# Patient Record
Sex: Male | Born: 1990 | Hispanic: No | Marital: Single | Smoking: Light tobacco smoker
Health system: Southern US, Community
[De-identification: ages and names within clinical notes are randomized; demographics above are authoritative.]

## PROBLEM LIST (undated history)

## (undated) ENCOUNTER — Emergency Department (HOSPITAL_COMMUNITY): Payer: Self-pay | Source: Home / Self Care

## (undated) HISTORY — PX: NO PAST SURGERIES: SHX2092

---
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
## 2014-01-23 ENCOUNTER — Ambulatory Visit (INDEPENDENT_AMBULATORY_CARE_PROVIDER_SITE_OTHER): Payer: 59 | Admitting: Physician Assistant

## 2014-01-23 VITALS — BP 120/76 | HR 74 | Temp 98.1°F | Resp 20 | Ht 68.0 in | Wt 215.0 lb

## 2014-01-23 DIAGNOSIS — R21 Rash and other nonspecific skin eruption: Secondary | ICD-10-CM

## 2014-01-23 DIAGNOSIS — N489 Disorder of penis, unspecified: Secondary | ICD-10-CM

## 2014-01-23 DIAGNOSIS — N4889 Other specified disorders of penis: Secondary | ICD-10-CM

## 2014-01-23 MED ORDER — HYDROXYZINE HCL 25 MG PO TABS: 25.0000 mg | ORAL_TABLET | Freq: Every evening | ORAL | Status: DC | PRN

## 2014-01-23 MED ORDER — CETIRIZINE HCL 10 MG PO TABS: 10.0000 mg | ORAL_TABLET | Freq: Every day | ORAL | Status: DC

## 2014-01-23 MED ORDER — PERMETHRIN 5 % EX CREA: 1.0000 "application " | TOPICAL_CREAM | Freq: Once | CUTANEOUS | Status: DC

## 2014-01-23 NOTE — Patient Instructions (Signed)
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
## 2014-01-24 LAB — RPR

## 2014-03-26 ENCOUNTER — Ambulatory Visit (INDEPENDENT_AMBULATORY_CARE_PROVIDER_SITE_OTHER): Payer: 59

## 2014-03-26 ENCOUNTER — Ambulatory Visit (INDEPENDENT_AMBULATORY_CARE_PROVIDER_SITE_OTHER): Payer: 59 | Admitting: Family Medicine

## 2014-03-26 VITALS — BP 102/68 | HR 89 | Temp 98.3°F | Resp 16 | Ht 69.0 in | Wt 209.0 lb

## 2014-03-26 DIAGNOSIS — M25561 Pain in right knee: Secondary | ICD-10-CM

## 2014-03-26 DIAGNOSIS — M25571 Pain in right ankle and joints of right foot: Secondary | ICD-10-CM

## 2014-03-26 DIAGNOSIS — T148 Other injury of unspecified body region: Secondary | ICD-10-CM

## 2014-03-26 DIAGNOSIS — T148XXA Other injury of unspecified body region, initial encounter: Secondary | ICD-10-CM
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
## 2014-03-26 NOTE — Patient Instructions (Signed)

## 2014-10-07 ENCOUNTER — Ambulatory Visit (INDEPENDENT_AMBULATORY_CARE_PROVIDER_SITE_OTHER): Payer: Self-pay | Admitting: Family Medicine

## 2014-10-07 VITALS — BP 110/66 | HR 65 | Temp 97.5°F | Resp 17 | Ht 69.0 in | Wt 209.0 lb

## 2014-10-07 DIAGNOSIS — B354 Tinea corporis: Secondary | ICD-10-CM

## 2014-10-07 MED ORDER — KETOCONAZOLE 2 % EX CREA: 1.0000 "application " | TOPICAL_CREAM | Freq: Two times a day (BID) | CUTANEOUS | Status: DC

## 2014-10-07 NOTE — Patient Instructions (Signed)

## 2014-10-07 NOTE — Progress Notes (Signed)
° °  Subjective:    Patient ID: Samuel Castaneda, male    DOB: 1991-01-22, 24 y.o.   MRN: [id number] This chart was scribed for Elvina SidleKurt Lauenstein, MD by Littie Deedsichard Sun, Medical Scribe. This patient was seen in Room 11 and the patient's care was started at 12:55 PM.   HPI HPI Comments: Samuel Castaneda is a 24 y.o. male who [facility] complaining of a rash to his left shoulder, the left side of his forehead, his right arm and lower abdomen area. Patient believes he has ringworm.  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  Review of Systems  Skin: Positive for rash.       Objective:   Physical Exam CONSTITUTIONAL: Well developed/well nourished HEAD: Normocephalic/atraumatic EYES: EOM/PERRL ENMT: Mucous membranes moist NECK: supple no meningeal signs SPINE: entire spine nontender CV: S1/S2 noted, no murmurs/rubs/gallops noted LUNGS: Lungs are clear to auscultation bilaterally, no apparent distress ABDOMEN: soft, nontender, no rebound or guarding GU: no cva tenderness NEURO: Pt is awake/alert, moves all extremitiesx4 EXTREMITIES: pulses normal, full ROM SKIN: Anular rash on his left shoulder with circumferential predominance and central clearing. He also has several other smaller areas of erythema and crustiness on his right arm and lower abdomen. PSYCH: no abnormalities of mood noted        Assessment & Plan:   This chart was scribed in my presence and reviewed by me personally.    ICD-9-CM ICD-10-CM   1. Tinea corporis 110.5 B35.4 ketoconazole (NIZORAL) 2 % cream     Signed, Elvina SidleKurt Lauenstein, MD

## 2015-04-08 ENCOUNTER — Encounter: Payer: Self-pay | Admitting: Physician Assistant

## 2015-04-08 ENCOUNTER — Ambulatory Visit (INDEPENDENT_AMBULATORY_CARE_PROVIDER_SITE_OTHER): Payer: Self-pay | Admitting: Family Medicine

## 2015-04-08 VITALS — BP 118/74 | HR 89 | Temp 98.6°F | Resp 16 | Ht 69.5 in | Wt 213.0 lb

## 2015-04-08 DIAGNOSIS — L239 Allergic contact dermatitis, unspecified cause: Secondary | ICD-10-CM

## 2015-04-08 DIAGNOSIS — L739 Follicular disorder, unspecified: Secondary | ICD-10-CM

## 2015-04-08 MED ORDER — PREDNISONE 20 MG PO TABS: ORAL_TABLET | ORAL | Status: DC

## 2015-04-08 MED ORDER — DOXYCYCLINE HYCLATE 100 MG PO CAPS: 100.0000 mg | ORAL_CAPSULE | Freq: Two times a day (BID) | ORAL | Status: AC

## 2015-04-08 NOTE — Patient Instructions (Signed)
Contact Dermatitis Dermatitis is redness, soreness, and swelling (inflammation) of the skin. Contact dermatitis is a reaction to certain substances that touch the skin. There are two types of contact dermatitis:   Irritant contact dermatitis. This type is caused by something that irritates your skin, such as dry hands from washing them too much. This type does not require previous exposure to the substance for a reaction to occur. This type is more common.  Allergic contact dermatitis. This type is caused by a substance that you are allergic to, such as a nickel allergy or poison ivy. This type only occurs if you have been exposed to the substance (allergen) before. Upon a repeat exposure, your body reacts to the substance. This type is less common. CAUSES  Many different substances can cause contact dermatitis. Irritant contact dermatitis is most commonly caused by exposure to:   Makeup.   Soaps.   Detergents.   Bleaches.   Acids.   Metal salts, such as nickel.  Allergic contact dermatitis is most commonly caused by exposure to:   Poisonous plants.   Chemicals.   Jewelry.   Latex.   Medicines.   Preservatives in products, such as clothing.  RISK FACTORS This condition is more likely to develop in:   People who have jobs that expose them to irritants or allergens.  People who have certain medical conditions, such as asthma or eczema.  SYMPTOMS  Symptoms of this condition may occur anywhere on your body where the irritant has touched you or is touched by you. Symptoms include:  Dryness or flaking.   Redness.   Cracks.   Itching.   Pain or a burning feeling.   Blisters.  Drainage of small amounts of blood or clear fluid from skin cracks. With allergic contact dermatitis, there may also be swelling in areas such as the eyelids, mouth, or genitals.  DIAGNOSIS  This condition is diagnosed with a medical history and physical exam. A patch skin test  may be performed to help determine the cause. If the condition is related to your job, you may need to see an occupational medicine specialist. TREATMENT Treatment for this condition includes figuring out what caused the reaction and protecting your skin from further contact. Treatment may also include:   Steroid creams or ointments. Oral steroid medicines may be needed in more severe cases.  Antibiotics or antibacterial ointments, if a skin infection is present.  Antihistamine lotion or an antihistamine taken by mouth to ease itching.  A bandage (dressing). HOME CARE INSTRUCTIONS Skin Care  Moisturize your skin as needed.   Apply cool compresses to the affected areas.  Try taking a bath with:  Epsom salts. Follow the instructions on the packaging. You can get these at your local pharmacy or grocery store.  Baking soda. Pour a small amount into the bath as directed by your health care provider.  Colloidal oatmeal. Follow the instructions on the packaging. You can get this at your local pharmacy or grocery store.  Try applying baking soda paste to your skin. Stir water into baking soda until it reaches a paste-like consistency.  Do not scratch your skin.  Bathe less frequently, such as every other day.  Bathe in lukewarm water. Avoid using hot water. Medicines  Take or apply over-the-counter and prescription medicines only as told by your health care provider.   If you were prescribed an antibiotic medicine, take or apply your antibiotic as told by your health care provider. Do not stop using the   antibiotic even if your condition starts to improve. General Instructions  Keep all follow-up visits as told by your health care provider. This is important.  Avoid the substance that caused your reaction. If you do not know what caused it, keep a journal to try to track what caused it. Write down:  What you eat.  What cosmetic products you use.  What you drink.  What  you wear in the affected area. This includes jewelry.  If you were given a dressing, take care of it as told by your health care provider. This includes when to change and remove it. SEEK MEDICAL CARE IF:   Your condition does not improve with treatment.  Your condition gets worse.  You have signs of infection such as swelling, tenderness, redness, soreness, or warmth in the affected area.  You have a fever.  You have new symptoms. SEEK IMMEDIATE MEDICAL CARE IF:   You have a severe headache, neck pain, or neck stiffness.  You vomit.  You feel very sleepy.  You notice red streaks coming from the affected area.  Your bone or joint underneath the affected area becomes painful after the skin has healed.  The affected area turns darker.  You have difficulty breathing.   This information is not intended to replace advice given to you by your health care provider. Make sure you discuss any questions you have with your health care provider.   Document Released: 05/15/2000 Document Revised: 02/06/2015 Document Reviewed: 10/03/2014 Elsevier Interactive Patient Education 2016 Elsevier Inc.  

## 2015-04-09 ENCOUNTER — Encounter: Payer: Self-pay | Admitting: Physician Assistant

## 2015-04-09 NOTE — Progress Notes (Signed)
[facility] [address], [place] [address] [phone]  Date:  04/08/2015   Name:  Samuel Castaneda   DOB:  March 15, 1991   MRN:  [id number]  PCP:  No primary care provider on file.    History of Present Illness:  Samuel Castaneda is a 24 y.o. male patient who presents to [facility] for cc of swollen lip and bumps under his lip and along upper groin.  Patient states that it started 1 week ago, after he shaved, he noticed a bump on the left side above his lip.  He then noticed scant bumps below the lip.  They are not painful.  He used soap while shaving instead of his usual shaving product.  He then felt tightness and tingling at his lips.  Over the next few days, he and his family, noticed his upper and lower lip swelling.  There is no swelling of his oral cavity.  He has had no sob.  No fever.   He also noticed similar rash along his groin, but this may have been present for about a month.  He generally trims in this area, but he has stopped, which has helped to relieve the symptoms.  Bumps at both the groin and the lips are similar to pimples and contain pus if squeezed.   He has applied carmax and aloe as well prior to the lip swelling.   There are no active problems to display for this patient.   History reviewed. No pertinent past medical history.  History reviewed. No pertinent past surgical history.  Social History  Substance Use Topics  . Smoking status: Never Smoker   . Smokeless tobacco: Never Used  . Alcohol Use: No    Family History  Problem Relation Age of Onset  . Diabetes Father   . Heart disease Father   . Hyperlipidemia Father   . Stroke Father     No Known Allergies  Medication list has been reviewed and updated.  Current Outpatient Prescriptions on File Prior to Visit  Medication Sig Dispense Refill  . cetirizine (ZYRTEC) 10 MG tablet Take 1 tablet (10 mg total) by mouth daily. (Patient not taking: Reported on 10/07/2014) 30 tablet 0  .  hydrOXYzine (ATARAX/VISTARIL) 25 MG tablet Take 1 tablet (25 mg total) by mouth at bedtime as needed for itching. (Patient not taking: Reported on 10/07/2014) 30 tablet 0  . ketoconazole (NIZORAL) 2 % cream Apply 1 application topically 2 (two) times daily. 60 g 1  . permethrin (ELIMITE) 5 % cream Apply 1 application topically once. Repeat in 14d if needed (Patient not taking: Reported on 10/07/2014) 60 g 1   No current facility-administered medications on file prior to visit.    ROS ROS otherwise unrmearkab le unless listed above.   Physical Examination: BP 118/74 mmHg  Pulse 89  Temp(Src) 98.6 F (37 C) (Oral)  Resp 16  Ht 5' 9.5" (1.765 m)  Wt 213 lb (96.616 kg)  BMI 31.01 kg/m2  SpO2 100% Ideal Body Weight: Weight in (lb) to have BMI = 25: 171.4  Physical Exam  Constitutional: He is oriented to person, place, and time. He appears well-developed and well-nourished. No distress.  HENT:  Head: Normocephalic and atraumatic.  Mouth/Throat: Mucous membranes are not pale, not dry and not cyanotic. No oropharyngeal exudate, posterior oropharyngeal edema, posterior oropharyngeal erythema or tonsillar abscesses.  Pustules are scant at the hairs beneath his lip.  Mildly honey crusted lesion at the right side inferior  to vermillion border and upper left side.  Lips appear edematous without fissures or scaling.    Pustules at the groin above the penis.  Hair at center of pustules.    Eyes: Conjunctivae and EOM are normal. Pupils are equal, round, and reactive to light.  Cardiovascular: Normal rate.   Pulmonary/Chest: Effort normal. No respiratory distress.  Neurological: He is alert and oriented to person, place, and time.  Skin: Skin is warm and dry. He is not diaphoretic.  Psychiatric: He has a normal mood and affect. His behavior is normal.     Assessment and Plan: Rosario JacksKhaled Tissue is a 24 y.o. male who is here today for bumps along beneath lip and upper groin.  Treating with prednisone to  reduce inflammation of face.  I have advised to not use the carmax or aloe at this time.  He can use petroleum along lip if needed. I would also like him to use doxycycline.  Possible contact dermatitis with irritated skin entry for infection.  Wound culture done.   1. Allergic contact dermatitis - predniSONE (DELTASONE) 20 MG tablet; Take 3 PO QAM x2days, 2 PO QAM x2days, 1 PO QAM x3days  Dispense: 13 tablet; Refill: 0  2. Folliculitis - doxycycline (VIBRAMYCIN) 100 MG capsule; Take 1 capsule (100 mg total) by mouth 2 (two) times daily.  Dispense: 14 capsule; Refill: 0 - Wound culture   Trena PlattStephanie Eliaz Fout, PA-C [facility] 04/09/2015 7:49 AM

## 2015-04-10 NOTE — Progress Notes (Signed)
Patient discussed/examined with Ms. English. . Agree with assessment and plan of care per her note.

## 2015-04-11 LAB — WOUND CULTURE
Gram Stain: NONE SEEN
Gram Stain: NONE SEEN

## 2016-01-07 ENCOUNTER — Ambulatory Visit (INDEPENDENT_AMBULATORY_CARE_PROVIDER_SITE_OTHER): Payer: Self-pay | Admitting: Physician Assistant

## 2016-01-07 VITALS — BP 118/64 | HR 67 | Temp 98.6°F | Resp 16 | Ht 69.0 in | Wt 199.0 lb

## 2016-01-07 DIAGNOSIS — Z113 Encounter for screening for infections with a predominantly sexual mode of transmission: Secondary | ICD-10-CM

## 2016-01-07 DIAGNOSIS — Z202 Contact with and (suspected) exposure to infections with a predominantly sexual mode of transmission: Secondary | ICD-10-CM

## 2016-01-07 MED ORDER — CEFTRIAXONE SODIUM 250 MG IJ SOLR
250.0000 mg | Freq: Once | INTRAMUSCULAR | Status: AC
  Administered 2016-01-07: 250 mg via INTRAMUSCULAR

## 2016-01-07 MED ORDER — AZITHROMYCIN 250 MG PO TABS: ORAL_TABLET | ORAL | 0 refills | Status: DC

## 2016-01-07 NOTE — Progress Notes (Signed)
   01/07/2016 6:08 PM   DOB: 1990-07-31 / MRN: [id number]  SUBJECTIVE:  Samuel Castaneda is a 25 y.o. male presenting for STD testing. Reports his girlfriend of 7 months was recently diagnosed with chlamydia.  He finds this odd and reports he was screened for STI before engaging in sexual activity and she was also screened as well and all test were negative.  He was wondering if she may have gotten chlamydia another way (aside for sexual activity). He denies any penile discharge and testicular tenderness.    He has No Known Allergies.   He  has no past medical history on file.    He  reports that he has been smoking.  He has never used smokeless tobacco. He reports that he does not drink alcohol or use drugs. He  has no sexual activity history on file. The patient  has no past surgical history on file.  His family history includes Diabetes in his father; Heart disease in his father; Hyperlipidemia in his father; Stroke in his father.  Review of Systems  Constitutional: Negative for chills and fever.  Gastrointestinal: Negative for nausea.  Genitourinary: Negative for dysuria.  Musculoskeletal: Negative for myalgias.  Skin: Negative for itching and rash.  Neurological: Negative for dizziness and headaches.    The problem list and medications were reviewed and updated by myself where necessary and exist elsewhere in the encounter.   OBJECTIVE:  BP 118/64 (BP Location: Right Arm, Patient Position: Sitting, Cuff Size: Normal)   Pulse 67   Temp 98.6 F (37 C)   Resp 16   Ht 5\' 9"  (1.753 m)   Wt 199 lb (90.3 kg)   SpO2 100%   BMI 29.39 kg/m   Physical Exam  Cardiovascular: Normal rate and regular rhythm.   Pulmonary/Chest: Effort normal and breath sounds normal.  Genitourinary: Testes normal and penis normal.    Right testis shows no mass, no swelling and no tenderness. Left testis shows no mass, no swelling and no tenderness. Circumcised.  Lymphadenopathy:       Right: No inguinal  adenopathy present.       Left: No inguinal adenopathy present.  Vitals reviewed.   No results found for this or any previous visit (from the past 72 hour(s)).  No results found.  ASSESSMENT AND PLAN  Samuel Castaneda was seen today for exposure to std.  Diagnoses and all orders for this visit:  Exposure to chlamydia -     cefTRIAXone (ROCEPHIN) injection 250 mg; Inject 250 mg into the muscle once. -     azithromycin (ZITHROMAX) 250 MG tablet; Take 4 tabs all at once with food.  Routine screening for STI (sexually transmitted infection) -     HIV antibody -     RPR    The patient is advised to call or return to clinic if he does not see an improvement in symptoms, or to seek the care of the closest emergency department if he worsens with the above plan.   Deliah BostonMichael Clark, MHS, PA-C [facility] 01/07/2016 6:08 PM

## 2016-01-07 NOTE — Patient Instructions (Signed)
     IF you received an x-ray today, you will receive an invoice from [facility]. Please contact [facility] at [phone] with questions or concerns regarding your invoice.   IF you received labwork today, you will receive an invoice from [facility]. Please contact [facility] at [phone] with questions or concerns regarding your invoice.   Our billing staff will not be able to assist you with questions regarding bills from these companies.  You will be contacted with the lab results as soon as they are available. The fastest way to get your results is to activate your My Chart account. Instructions are located on the last page of this paperwork. If you have not heard from us regarding the results in 2 weeks, please contact this office.      

## 2016-01-08 LAB — HIV ANTIBODY (ROUTINE TESTING W REFLEX): HIV: NONREACTIVE

## 2016-01-09 LAB — RPR

## 2017-02-24 ENCOUNTER — Encounter (HOSPITAL_COMMUNITY): Payer: Self-pay | Admitting: *Deleted

## 2017-02-24 ENCOUNTER — Inpatient Hospital Stay (HOSPITAL_COMMUNITY)
Admission: EM | Admit: 2017-02-24 | Discharge: 2017-03-08 | DRG: 562 | Disposition: A | Discharge status: Home / Self Care | Payer: Self-pay | Attending: Surgery | Admitting: Surgery

## 2017-02-24 DIAGNOSIS — S27321A Contusion of lung, unilateral, initial encounter: Secondary | ICD-10-CM

## 2017-02-24 DIAGNOSIS — S129XXA Fracture of neck, unspecified, initial encounter: Secondary | ICD-10-CM

## 2017-02-24 DIAGNOSIS — K5900 Constipation, unspecified: Secondary | ICD-10-CM | POA: Diagnosis not present

## 2017-02-24 DIAGNOSIS — S22009A Unspecified fracture of unspecified thoracic vertebra, initial encounter for closed fracture: Secondary | ICD-10-CM

## 2017-02-24 DIAGNOSIS — Y92411 Interstate highway as the place of occurrence of the external cause: Secondary | ICD-10-CM

## 2017-02-24 DIAGNOSIS — S42101A Fracture of unspecified part of scapula, right shoulder, initial encounter for closed fracture: Secondary | ICD-10-CM

## 2017-02-24 DIAGNOSIS — S42141A Displaced fracture of glenoid cavity of scapula, right shoulder, initial encounter for closed fracture: Principal | ICD-10-CM | POA: Diagnosis present

## 2017-02-24 DIAGNOSIS — S32010A Wedge compression fracture of first lumbar vertebra, initial encounter for closed fracture: Secondary | ICD-10-CM

## 2017-02-24 DIAGNOSIS — R55 Syncope and collapse: Secondary | ICD-10-CM | POA: Diagnosis present

## 2017-02-24 DIAGNOSIS — S36039A Unspecified laceration of spleen, initial encounter: Secondary | ICD-10-CM

## 2017-02-24 DIAGNOSIS — S32018A Other fracture of first lumbar vertebra, initial encounter for closed fracture: Secondary | ICD-10-CM

## 2017-02-24 DIAGNOSIS — S12600A Unspecified displaced fracture of seventh cervical vertebra, initial encounter for closed fracture: Secondary | ICD-10-CM | POA: Diagnosis present

## 2017-02-24 DIAGNOSIS — F17200 Nicotine dependence, unspecified, uncomplicated: Secondary | ICD-10-CM | POA: Diagnosis present

## 2017-02-24 DIAGNOSIS — S22019A Unspecified fracture of first thoracic vertebra, initial encounter for closed fracture: Secondary | ICD-10-CM | POA: Diagnosis present

## 2017-02-24 DIAGNOSIS — S36032A Major laceration of spleen, initial encounter: Secondary | ICD-10-CM | POA: Diagnosis present

## 2017-02-24 DIAGNOSIS — S32019A Unspecified fracture of first lumbar vertebra, initial encounter for closed fracture: Secondary | ICD-10-CM | POA: Diagnosis present
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
## 2017-02-25 ENCOUNTER — Emergency Department (HOSPITAL_COMMUNITY): Payer: Self-pay

## 2017-02-25 ENCOUNTER — Encounter (HOSPITAL_COMMUNITY): Payer: Self-pay | Admitting: General Practice

## 2017-02-25 ENCOUNTER — Inpatient Hospital Stay (HOSPITAL_COMMUNITY): Payer: Self-pay

## 2017-02-25 LAB — COMPREHENSIVE METABOLIC PANEL
ALT: 23 U/L (ref 17–63)
AST: 39 U/L (ref 15–41)
Albumin: 4 g/dL (ref 3.5–5.0)
Alkaline Phosphatase: 58 U/L (ref 38–126)
Anion gap: 10 (ref 5–15)
BILIRUBIN TOTAL: 1.5 mg/dL — AB (ref 0.3–1.2)
BUN: 10 mg/dL (ref 6–20)
CO2: 23 mmol/L (ref 22–32)
CREATININE: 1.02 mg/dL (ref 0.61–1.24)
Calcium: 9.1 mg/dL (ref 8.9–10.3)
Chloride: 103 mmol/L (ref 101–111)
GFR calc Af Amer: >60 mL/min (ref >60)
Glucose, Bld: 112 mg/dL — ABNORMAL HIGH (ref 65–99)
Potassium: 3.7 mmol/L (ref 3.5–5.1)
Sodium: 136 mmol/L (ref 135–145)
TOTAL PROTEIN: 7.1 g/dL (ref 6.5–8.1)

## 2017-02-25 LAB — I-STAT CHEM 8, ED
BUN: 12 mg/dL (ref 6–20)
CHLORIDE: 105 mmol/L (ref 101–111)
Calcium, Ion: 1.11 mmol/L — ABNORMAL LOW (ref 1.15–1.40)
Creatinine, Ser: 0.8 mg/dL (ref 0.61–1.24)
GLUCOSE: 107 mg/dL — AB (ref 65–99)
HEMATOCRIT: 40 % (ref 39.0–52.0)
HEMOGLOBIN: 13.6 g/dL (ref 13.0–17.0)
POTASSIUM: 3.8 mmol/L (ref 3.5–5.1)
Sodium: 138 mmol/L (ref 135–145)
TCO2: 22 mmol/L (ref 22–32)

## 2017-02-25 LAB — CBC WITH DIFFERENTIAL/PLATELET
BASOS ABS: 0 10*3/uL (ref 0.0–0.1)
Basophils Relative: 0 %
Eosinophils Absolute: 0.1 10*3/uL (ref 0.0–0.7)
Eosinophils Relative: 1 %
HEMATOCRIT: 44.1 % (ref 39.0–52.0)
Hemoglobin: 15.2 g/dL (ref 13.0–17.0)
LYMPHS PCT: 27 %
Lymphs Abs: 3.6 10*3/uL (ref 0.7–4.0)
MCH: 28.1 pg (ref 26.0–34.0)
MCHC: 34.5 g/dL (ref 30.0–36.0)
MCV: 81.5 fL (ref 78.0–100.0)
MONO ABS: 0.8 10*3/uL (ref 0.1–1.0)
Monocytes Relative: 6 %
NEUTROS ABS: 8.7 10*3/uL — AB (ref 1.7–7.7)
Neutrophils Relative %: 66 %
Platelets: 193 10*3/uL (ref 150–400)
RBC: 5.41 MIL/uL (ref 4.22–5.81)
RDW: 13.3 % (ref 11.5–15.5)
WBC: 13.2 10*3/uL — ABNORMAL HIGH (ref 4.0–10.5)

## 2017-02-25 LAB — BASIC METABOLIC PANEL
ANION GAP: 10 (ref 5–15)
BUN: 11 mg/dL (ref 6–20)
CHLORIDE: 104 mmol/L (ref 101–111)
CO2: 21 mmol/L — ABNORMAL LOW (ref 22–32)
Calcium: 8.7 mg/dL — ABNORMAL LOW (ref 8.9–10.3)
Creatinine, Ser: 0.89 mg/dL (ref 0.61–1.24)
GFR calc non Af Amer: >60 mL/min (ref >60)
Glucose, Bld: 95 mg/dL (ref 65–99)
POTASSIUM: 3.7 mmol/L (ref 3.5–5.1)
SODIUM: 135 mmol/L (ref 135–145)

## 2017-02-25 LAB — URINALYSIS, ROUTINE W REFLEX MICROSCOPIC
Bilirubin Urine: NEGATIVE
GLUCOSE, UA: NEGATIVE mg/dL
Ketones, ur: 20 mg/dL — AB
Leukocytes, UA: NEGATIVE
Nitrite: NEGATIVE
PH: 5 (ref 5.0–8.0)
PROTEIN: NEGATIVE mg/dL
Specific Gravity, Urine: >1.046 — ABNORMAL HIGH (ref 1.005–1.030)
Squamous Epithelial / LPF: NONE SEEN

## 2017-02-25 LAB — RAPID URINE DRUG SCREEN, HOSP PERFORMED
Amphetamines: NOT DETECTED
BARBITURATES: NOT DETECTED
Benzodiazepines: NOT DETECTED
COCAINE: NOT DETECTED
OPIATES: POSITIVE — AB
TETRAHYDROCANNABINOL: POSITIVE — AB

## 2017-02-25 LAB — CBC
HCT: 39.9 % (ref 39.0–52.0)
HEMATOCRIT: 41.8 % (ref 39.0–52.0)
HEMOGLOBIN: 13.8 g/dL (ref 13.0–17.0)
Hemoglobin: 14.3 g/dL (ref 13.0–17.0)
MCH: 27.8 pg (ref 26.0–34.0)
MCH: 28.2 pg (ref 26.0–34.0)
MCHC: 34.2 g/dL (ref 30.0–36.0)
MCHC: 34.6 g/dL (ref 30.0–36.0)
MCV: 81.3 fL (ref 78.0–100.0)
MCV: 81.4 fL (ref 78.0–100.0)
PLATELETS: 168 10*3/uL (ref 150–400)
Platelets: 157 10*3/uL (ref 150–400)
RBC: 5.14 MIL/uL (ref 4.22–5.81)
RBC: 4.9 MIL/uL (ref 4.22–5.81)
RDW: 13.2 % (ref 11.5–15.5)
RDW: 13.5 % (ref 11.5–15.5)
WBC: 18 10*3/uL — AB (ref 4.0–10.5)
WBC: 11 10*3/uL — AB (ref 4.0–10.5)

## 2017-02-25 LAB — ETHANOL: Alcohol, Ethyl (B): <10 mg/dL (ref <10)

## 2017-02-25 LAB — TYPE AND SCREEN
ABO/RH(D): O POS
Antibody Screen: NEGATIVE

## 2017-02-25 LAB — HIV ANTIBODY (ROUTINE TESTING W REFLEX): HIV SCREEN 4TH GENERATION: NONREACTIVE

## 2017-02-25 LAB — ABO/RH: ABO/RH(D): O POS

## 2017-02-25 MED ORDER — ONDANSETRON HCL 4 MG/2ML IJ SOLN
4.0000 mg | Freq: Once | INTRAMUSCULAR | Status: AC
  Administered 2017-02-25: 4 mg via INTRAVENOUS
  Filled 2017-02-25: qty 2

## 2017-02-25 MED ORDER — HYDROMORPHONE HCL 1 MG/ML IJ SOLN
1.0000 mg | Freq: Once | INTRAMUSCULAR | Status: AC
  Administered 2017-02-25: 1 mg via INTRAVENOUS
  Filled 2017-02-25: qty 1

## 2017-02-25 MED ORDER — KCL IN DEXTROSE-NACL 20-5-0.9 MEQ/L-%-% IV SOLN
INTRAVENOUS | Status: DC
  Administered 2017-02-25 – 2017-02-27 (×4): via INTRAVENOUS
  Administered 2017-02-28 – 2017-03-01 (×2): 75 mL/h via INTRAVENOUS
  Administered 2017-03-01: 18:00:00 via INTRAVENOUS
  Administered 2017-03-03: 75 mL via INTRAVENOUS
  Filled 2017-02-25 (×13): qty 1000

## 2017-02-25 MED ORDER — ONDANSETRON HCL 4 MG/2ML IJ SOLN
4.0000 mg | Freq: Four times a day (QID) | INTRAMUSCULAR | Status: DC | PRN
  Administered 2017-02-25 – 2017-03-03 (×3): 4 mg via INTRAVENOUS
  Filled 2017-02-25 (×2): qty 2

## 2017-02-25 MED ORDER — OXYCODONE HCL 5 MG PO TABS
5.0000 mg | ORAL_TABLET | ORAL | Status: DC | PRN
  Administered 2017-02-25 – 2017-02-27 (×7): 5 mg via ORAL
  Filled 2017-02-25 (×7): qty 1

## 2017-02-25 MED ORDER — MORPHINE SULFATE (PF) 4 MG/ML IV SOLN
4.0000 mg | Freq: Once | INTRAVENOUS | Status: AC
  Administered 2017-02-25: 4 mg via INTRAVENOUS
  Filled 2017-02-25: qty 1

## 2017-02-25 MED ORDER — HYDROMORPHONE HCL 1 MG/ML IJ SOLN
1.0000 mg | INTRAMUSCULAR | Status: DC | PRN
  Administered 2017-02-25 – 2017-03-08 (×43): 1 mg via INTRAVENOUS
  Filled 2017-02-25 (×50): qty 1

## 2017-02-25 MED ORDER — ONDANSETRON 4 MG PO TBDP
4.0000 mg | ORAL_TABLET | Freq: Four times a day (QID) | ORAL | Status: DC | PRN
Start: 2017-02-25 — End: 2017-03-08
  Filled 2017-02-25: qty 1

## 2017-02-25 MED ORDER — ENOXAPARIN SODIUM 40 MG/0.4ML ~~LOC~~ SOLN
40.0000 mg | SUBCUTANEOUS | Status: DC
  Administered 2017-02-26 – 2017-03-08 (×11): 40 mg via SUBCUTANEOUS
  Filled 2017-02-25 (×12): qty 0.4

## 2017-02-25 MED ORDER — IOPAMIDOL (ISOVUE-300) INJECTION 61%
INTRAVENOUS | Status: AC
Start: 2017-02-25 — End: 2017-02-25
  Administered 2017-02-25: 100 mL
  Filled 2017-02-25: qty 100
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
## 2017-02-25 NOTE — ED Notes (Signed)
pts pain no better 
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
## 2017-02-25 NOTE — ED Notes (Signed)
Pain med given going back to c-t soon

## 2017-02-25 NOTE — ED Notes (Signed)
Pt returned from ct
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
## 2017-02-25 NOTE — ED Notes (Signed)
No pain unless he moves at present

## 2017-02-25 NOTE — ED Notes (Addendum)
Dr cornett here to see 

## 2017-02-25 NOTE — ED Notes (Signed)
Ortho tech called to place shoulder immobilizer on this pt
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
## 2017-02-25 NOTE — Progress Notes (Signed)
Orthopedic Tech Progress Note Patient Details:  Samuel Castaneda 1990-09-14 [id number]  Ortho Devices Type of Ortho Device: Sling immobilizer Ortho Device/Splint Location: rue Ortho Device/Splint Interventions: Ordered, Application, Adjustment   Trinna Post 02/25/2017, 3:45 AM

## 2017-02-25 NOTE — ED Notes (Signed)
The pt reports that he  Does not need to void
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
## 2017-02-25 NOTE — ED Notes (Signed)
The pt returned momentarily then xray came to get him  Alert calmer.  Still unable to locate his girlfriend by phone.. Still c/o pain in his back
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
## 2017-02-25 NOTE — ED Notes (Signed)
Back to c-t 

## 2017-02-25 NOTE — ED Notes (Signed)
pts mother  At the bedside

## 2017-02-25 NOTE — Progress Notes (Signed)
Rechecked patient, feeling okay just having some back pain from laying in bed. In brace. Some nausea earlier, but resolved quickly without medication. Denies abdominal pain, chest pain, SOB.  Bowel sounds a little hypoactive, but belly is soft. Will continue to monitor. Dr. Franky Macho planning trial of bedrest and conservative treatment. C spine cleared.  Wells Guiles , [facility] 02/25/2017, 4:28 PM Pager: [phone] Mon-Fri 7:00 am-4:30 pm Sat-Sun 7:00 am-11:30 am

## 2017-02-26 MED ORDER — DIPHENHYDRAMINE HCL 12.5 MG/5ML PO ELIX
25.0000 mg | ORAL_SOLUTION | Freq: Once | ORAL | Status: AC
  Administered 2017-02-26: 25 mg via ORAL
  Filled 2017-02-26: qty 10

## 2017-02-26 MED ORDER — ACETAMINOPHEN 325 MG PO TABS
650.0000 mg | ORAL_TABLET | Freq: Four times a day (QID) | ORAL | Status: DC
  Administered 2017-02-26 – 2017-03-01 (×11): 650 mg via ORAL
  Filled 2017-02-26 (×11): qty 2

## 2017-02-26 MED ORDER — ENSURE ENLIVE PO LIQD
237.0000 mL | Freq: Two times a day (BID) | ORAL | Status: DC
  Administered 2017-02-26 – 2017-03-01 (×6): 237 mL via ORAL

## 2017-02-26 MED ORDER — BIOTENE DRY MOUTH MT LIQD: 15.0000 mL | OROMUCOSAL | Status: DC | PRN

## 2017-02-26 MED ORDER — METHOCARBAMOL 750 MG PO TABS
750.0000 mg | ORAL_TABLET | Freq: Three times a day (TID) | ORAL | Status: DC | PRN
  Administered 2017-02-26 – 2017-03-02 (×12): 750 mg via ORAL
  Filled 2017-02-26 (×12): qty 1

## 2017-02-26 NOTE — Progress Notes (Signed)
Patient ID: Samuel Castaneda, male   DOB: 06-22-1990, 26 y.o.   MRN: [id number] BP 114/60 (BP Location: Left Arm)   Pulse 65   Temp 98.6 F (37 C) (Oral)   Resp 16   Ht  (1.753 m)   Wt 93 kg (205 lb)   SpO2 100%   BMI 30.27 kg/m  Alert and oriented x 4 speech is clear and fluent Moving all extremities well Will obtain tlso  Continue bedrest for 1 week.
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
## 2017-02-27 LAB — CBC
HCT: 40 % (ref 39.0–52.0)
Hemoglobin: 13.8 g/dL (ref 13.0–17.0)
MCH: 27.9 pg (ref 26.0–34.0)
MCHC: 34.5 g/dL (ref 30.0–36.0)
MCV: 80.8 fL (ref 78.0–100.0)
Platelets: 122 10*3/uL — ABNORMAL LOW (ref 150–400)
RBC: 4.95 MIL/uL (ref 4.22–5.81)
RDW: 13.1 % (ref 11.5–15.5)
WBC: 6.9 10*3/uL (ref 4.0–10.5)

## 2017-02-27 MED ORDER — OXYCODONE HCL 5 MG PO TABS
10.0000 mg | ORAL_TABLET | ORAL | Status: DC | PRN
  Administered 2017-02-27 – 2017-03-08 (×24): 10 mg via ORAL
  Filled 2017-02-27 (×29): qty 2

## 2017-02-27 MED ORDER — DIPHENHYDRAMINE HCL 25 MG PO CAPS
25.0000 mg | ORAL_CAPSULE | Freq: Four times a day (QID) | ORAL | Status: DC | PRN
  Administered 2017-02-27 – 2017-03-08 (×16): 25 mg via ORAL
  Filled 2017-02-27 (×17): qty 1

## 2017-02-27 NOTE — Progress Notes (Signed)
Neurosurgery Progress Note  No issues overnight. Back pain as expected. Fairly controlled. No radicular symptoms or weakness in LE  EXAM:  BP 125/60 (BP Location: Left Arm)   Pulse 69   Temp 98.1 F (36.7 C) (Oral)   Resp 15   Ht  (1.753 m)   Wt 93 kg (205 lb)   SpO2 99%   BMI 30.27 kg/m   Awake, alert, oriented  Speech fluent, appropriate  CN grossly intact  MAEW with good strength. Unable to assess RUE strength due to sling.   PLAN Stable. Continue with current care.  Bedrest. TLSO.
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
## 2017-02-28 NOTE — Progress Notes (Signed)
Patient had multiple complaints over course of the shift, mostly revolving around his "sweaty back" and itching as a result.  He states he is "sick of sticking it out" and wants something done about it.  However, when suggestions are made re:  Changing sheets, applying powder, changing to a cotton product, and multiple other suggestions, he refused to try.  He stated the only thing he wanted was a cooling blanket to lay on and he insisted that I call the physician so one could be ordered for him.  Approximately 20 minutes was spent explaining to the patient that a cooling blanket could not be used on this unit or for his particular circumstance, but we would be happy to attempt other alternatives to make him as comfortable as possible.  It was also taken into consideration that this patient was understandably having difficulty with his current circumstances and having to be dependant on others, which he is not used to.  We spent time earlier in the shift reviewing any questions he may have about the following;  Constipation issues and potential resolutions, pain management, etc.  He was visited by friends and family, which was certainly encouraged.

## 2017-02-28 NOTE — Progress Notes (Signed)
Neurosurgery Progress Note  No issues overnight. Back pain improved from yesterday No radicular symptoms or weakness in LE  EXAM:  BP 121/71 (BP Location: Left Arm)   Pulse 86   Temp 98.7 F (37.1 C) (Oral)   Resp 16   Ht  (1.753 m)   Wt 93 kg (205 lb)   SpO2 100%   BMI 30.27 kg/m   Awake, alert, oriented  Speech fluent, appropriate  CN grossly intact  MAEW with good strength. Unable to assess RUE strength due to sling.   PLAN Stable. Continue with current care.  Bedrest. TLSO.

## 2017-02-28 NOTE — Progress Notes (Signed)
New sling ordered for patient, as per his request.  He stated he would be happier if he could get a sling that he could alternate with the one he already has for aesthetics sake.  This was done as requested.
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
## 2017-03-01 MED ORDER — ACETAMINOPHEN 500 MG PO TABS
1000.0000 mg | ORAL_TABLET | Freq: Four times a day (QID) | ORAL | Status: DC
  Administered 2017-03-01 – 2017-03-08 (×24): 1000 mg via ORAL
  Filled 2017-03-01 (×26): qty 2

## 2017-03-01 MED ORDER — ENSURE ENLIVE PO LIQD
237.0000 mL | Freq: Three times a day (TID) | ORAL | Status: DC
  Administered 2017-03-01 – 2017-03-08 (×10): 237 mL via ORAL

## 2017-03-01 NOTE — Progress Notes (Signed)
Patient ID: Samuel Castaneda, male   DOB: 1990-06-29, 26 y.o.   MRN: [id number] BP (!) 125/56 (BP Location: Left Arm)   Pulse 74   Temp 98.7 F (37.1 C) (Oral)   Resp 16   Ht  (1.753 m)   Wt 93 kg (205 lb)   SpO2 100%   BMI 30.27 kg/m  Alert and oriented x 4, speech is clear and fluent Moving lower extremities well Less pain in lumbar region Brace on Wednesday
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
## 2017-03-02 LAB — CBC
HCT: 38.8 % — ABNORMAL LOW (ref 39.0–52.0)
Hemoglobin: 13.5 g/dL (ref 13.0–17.0)
MCH: 27.7 pg (ref 26.0–34.0)
MCHC: 34.8 g/dL (ref 30.0–36.0)
MCV: 79.7 fL (ref 78.0–100.0)
Platelets: 142 10*3/uL — ABNORMAL LOW (ref 150–400)
RBC: 4.87 MIL/uL (ref 4.22–5.81)
RDW: 12.9 % (ref 11.5–15.5)
WBC: 7.2 10*3/uL (ref 4.0–10.5)
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
## 2017-03-02 NOTE — Progress Notes (Signed)
Patient ID: Samuel Castaneda, male   DOB: [birth year], 26 y.o.   MRN: [id number] BP 118/61 (BP Location: Left Arm)   Pulse 79   Temp 98.1 F (36.7 C) (Oral)   Resp 18   Ht  (1.753 m)   Wt 93 kg (205 lb)   SpO2 100%   BMI 30.27 kg/m   Stable, no change in exam.  May be up tomorrow if brace here.

## 2017-03-03 MED ORDER — POLYETHYLENE GLYCOL 3350 17 G PO PACK
17.0000 g | PACK | Freq: Two times a day (BID) | ORAL | Status: DC | PRN
Start: 2017-03-03 — End: 2017-03-06
  Filled 2017-03-03 (×2): qty 1

## 2017-03-03 MED ORDER — METHOCARBAMOL 500 MG PO TABS
1000.0000 mg | ORAL_TABLET | Freq: Three times a day (TID) | ORAL | Status: DC | PRN
  Administered 2017-03-03 – 2017-03-08 (×12): 1000 mg via ORAL
  Filled 2017-03-03 (×15): qty 2
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
## 2017-03-03 NOTE — Progress Notes (Signed)
Patient ID: Samuel Castaneda, male   DOB: 1991/03/30, 26 y.o.   MRN: [id number] BP 113/74 (BP Location: Left Arm)   Pulse 89   Temp 98.4 F (36.9 C) (Oral)   Resp 18   Ht  (1.753 m)   Wt 93 kg (205 lb)   SpO2 98%   BMI 30.27 kg/m  Alert and oriented x 4, speech is clear, and fluent Will order up and down films in the brace for tomorrow morning. If there is no significant shift will allow him to be up.  Normal motor exam. Moving all extremities.

## 2017-03-03 NOTE — Progress Notes (Signed)
Orthopedic Tech Progress Note Patient Details:  Samuel Castaneda November 15, 1990 [id number]  Patient ID: [name], male   DOB: July 11, 1990, 26 y.o.   MRN: [id number]   Nikki Dom 03/03/2017, 9:41 AM Called in bio-tech brace order; spoke with [facility]

## 2017-03-04 ENCOUNTER — Inpatient Hospital Stay (HOSPITAL_COMMUNITY): Payer: Self-pay

## 2017-03-04 LAB — CREATININE, SERUM
CREATININE: 0.8 mg/dL (ref 0.61–1.24)
GFR calc Af Amer: >60 mL/min (ref >60)

## 2017-03-04 MED ORDER — HYDROMORPHONE HCL 1 MG/ML IJ SOLN
1.0000 mg | Freq: Once | INTRAMUSCULAR | Status: AC
  Administered 2017-03-05: 1 mg via INTRAVENOUS

## 2017-03-04 MED ORDER — IBUPROFEN 200 MG PO TABS
400.0000 mg | ORAL_TABLET | Freq: Four times a day (QID) | ORAL | Status: DC
  Administered 2017-03-04 – 2017-03-08 (×14): 400 mg via ORAL
  Filled 2017-03-04 (×16): qty 2

## 2017-03-04 NOTE — Progress Notes (Signed)
This nurse received a call from radiology stating that they needed assistance with the patient's brace because the doctor ordered images of the patient laying and standing.  I informed the x-ray tech that the patient has not stood up in the week that he has been here and the plan was for him to work with PT today. The patient is on the way back to the unit and it was discussed that after he works with therapy today and they don the brace, we will send him back down to radiology.

## 2017-03-04 NOTE — Progress Notes (Signed)
PT Cancellation Note  Patient Details Name: Samuel Castaneda MRN: [id number] DOB: 09/29/1990   Cancelled Treatment:    Reason Eval/Treat Not Completed: Other (comment); order received, but no clarification on brace donning position or OOB (current orders for lay flat and log roll only).  Spoke with RN who wanted PT to don brace and get pt up to allow for x-rays (which, according to MD notes were to be completed to determine if okay for OOB).  Discussed need for brace clarification and OOB orders.  RN reports Therapist, sports to don brace and PT assisted [employer] to don brace prior to returning to radiology for x-rays.  Will follow up.   Elray Mcgregor 03/04/2017, 3:26 PM  Sheran Lawless, [place] [phone] 03/04/2017
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
## 2017-03-04 NOTE — Plan of Care (Signed)
Problem: Pain Managment: Goal: General experience of comfort will improve Patient voices understanding of pain scale and calls for pain medication when needed   
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
## 2017-03-04 NOTE — Progress Notes (Signed)
Patient ID: Samuel Castaneda, male   DOB: [birth year], 27 y.o.   MRN: [id number] BP 129/63 (BP Location: Left Arm)   Pulse 74   Temp 98.2 F (36.8 C) (Oral)   Resp 16   Ht  (1.753 m)   Wt 93 kg (205 lb)   SpO2 96%   BMI 30.27 kg/m  Alert and oriented x 4 Full strength in the lower extremities No pain when upright for xrays No gross movement seen on the films. Will wait on radiologist read. Doing well.
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
## 2017-03-05 NOTE — Progress Notes (Signed)
Patient ID: Samuel Castaneda, male   DOB: Aug 28, 1990, 28 y.o.   MRN: [id number] BP 131/69 (BP Location: Right Leg)   Pulse 66   Temp 98.4 F (36.9 C) (Oral)   Resp 16   Ht  (1.753 m)   Wt 93 kg (205 lb)   SpO2 100%   BMI 30.27 kg/m  Alert and oriented. Spine is stable in the brace.  He however is not ready yet to go, doesn't feel great on his feet. Still in a lot of pain.  May be discharged when ready
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
## 2017-03-05 NOTE — Progress Notes (Signed)
PT Cancellation Note  Patient Details Name: Samuel Castaneda MRN: [id number] DOB: 1990/10/16   Cancelled Treatment:    Reason Eval/Treat Not Completed: Other (comment) (pt with bedflat order and no active order for OOB as of yet)   Eryn Marandola B Dayden Viverette 03/05/2017, 7:24 AM  Delaney Meigs, PT [phone]
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
## 2017-03-05 NOTE — Progress Notes (Signed)
Thank you for consult on MR. Rubi. Chart and therapy notes reviewed. Patient is currently modified independent to supervision level. He's too high level for CIR and agree with recommendations for follow up outpatient therapy. Will defer CIR consult.

## 2017-03-06 MED ORDER — POLYETHYLENE GLYCOL 3350 17 G PO PACK
17.0000 g | PACK | Freq: Two times a day (BID) | ORAL | Status: DC
  Filled 2017-03-06 (×2): qty 1

## 2017-03-06 MED ORDER — FLEET ENEMA 7-19 GM/118ML RE ENEM
1.0000 | ENEMA | Freq: Once | RECTAL | Status: DC
  Filled 2017-03-06: qty 1

## 2017-03-06 MED ORDER — MAGNESIUM CITRATE PO SOLN
1.0000 | Freq: Once | ORAL | Status: AC
  Administered 2017-03-06: 1 via ORAL
  Filled 2017-03-06: qty 296

## 2017-03-06 NOTE — Progress Notes (Signed)
OT Cancellation Note  Patient Details Name: Samuel Castaneda MRN: [id number] DOB: 05-12-1991   Cancelled Treatment:    Reason Eval/Treat Not Completed: Pain limiting ability to participate. Pt supine in bed and reports he just took oral pain meds (pain 5/10). Request OT to reattempt.   Raynald Kemp OTR/L Pager: [phone]  03/06/2017, 12:16 PM
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
## 2017-03-06 NOTE — Plan of Care (Signed)
Problem: Physical Regulation: Goal: Ability to maintain clinical measurements within normal limits will improve Outcome: Not Progressing Pt take dilaudid  IV oxy , and robaxin routinely. Pt needs to be weaned of dilaudid.
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
## 2017-03-08 MED ORDER — OXYCODONE HCL 10 MG PO TABS: 10.0000 mg | ORAL_TABLET | ORAL | 0 refills | Status: DC | PRN

## 2017-03-08 MED ORDER — POLYETHYLENE GLYCOL 3350 17 G PO PACK: 17.0000 g | PACK | Freq: Every day | ORAL | 0 refills | Status: AC | PRN

## 2017-03-08 MED ORDER — METHOCARBAMOL 500 MG PO TABS: 1000.0000 mg | ORAL_TABLET | Freq: Three times a day (TID) | ORAL | 0 refills | Status: DC | PRN

## 2017-03-08 MED ORDER — ACETAMINOPHEN 500 MG PO TABS: 1000.0000 mg | ORAL_TABLET | Freq: Four times a day (QID) | ORAL | 0 refills | Status: AC | PRN

## 2017-03-08 MED ORDER — IBUPROFEN 400 MG PO TABS: 400.0000 mg | ORAL_TABLET | Freq: Four times a day (QID) | ORAL | 0 refills | Status: AC | PRN
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
## 2017-03-08 NOTE — Progress Notes (Signed)
Orthopedic Tech Progress Note Patient Details:  Samuel Castaneda Dec 24, 1990 [id number]  Patient ID: Samuel Castaneda, male   DOB: 1991-05-12, 26 y.o.   MRN: [id number]   Samuel Castaneda 03/08/2017, 9:39 AM Called in bio-tech brace order; spoke with Samuel Castaneda
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
## 2017-03-08 NOTE — Progress Notes (Signed)
Right Scapula and Glenoid Fractures: Case discussed with Dr. Wandra Feinstein and his nursing team.   Continue current plan of Non-operative treatment. NWB RUE, maintain sling, No active / passive ROM RUE. Follow-up with Dr. Wandra Feinstein in the office this week or early next week.

## 2017-03-08 NOTE — Progress Notes (Signed)
OT Note    03/08/17 1300  OT Visit Information  Last OT Received On 03/08/17  OT Time Calculation  OT Start Time (ACUTE ONLY) 1146   OT Stop Time (ACUTE ONLY) 1220  OT Time Calculation (min) 34 min  2nd visit 1309-1318 (9 min)  OT General Charges  $OT Visit (2 visits)  OT Treatments  $Self Care/Home Management  38-52 mins  [facility], OT/L  [phone] 03/08/2017
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
## 2017-03-08 NOTE — Discharge Instructions (Signed)
Keep TLSO back brace on - you may shower with the brace on.  Continue not to move your right arm until you see orthopedic surgery this week. Continue to wear the sling.
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
## 2018-04-19 ENCOUNTER — Ambulatory Visit (INDEPENDENT_AMBULATORY_CARE_PROVIDER_SITE_OTHER): Payer: Self-pay | Admitting: Family Medicine

## 2018-04-19 ENCOUNTER — Other Ambulatory Visit: Payer: Self-pay

## 2018-04-19 ENCOUNTER — Encounter: Payer: Self-pay | Admitting: Family Medicine

## 2018-04-19 VITALS — BP 118/68 | HR 57 | Temp 98.2°F | Ht 69.0 in | Wt 208.0 lb

## 2018-04-19 DIAGNOSIS — Z7689 Persons encountering health services in other specified circumstances: Secondary | ICD-10-CM

## 2018-04-25 DIAGNOSIS — Z7689 Persons encountering health services in other specified circumstances: Secondary | ICD-10-CM | POA: Insufficient documentation
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
## 2019-01-13 ENCOUNTER — Encounter (HOSPITAL_BASED_OUTPATIENT_CLINIC_OR_DEPARTMENT_OTHER): Payer: Self-pay | Admitting: *Deleted

## 2019-01-13 ENCOUNTER — Emergency Department (HOSPITAL_BASED_OUTPATIENT_CLINIC_OR_DEPARTMENT_OTHER): Payer: Self-pay

## 2019-01-13 ENCOUNTER — Other Ambulatory Visit: Payer: Self-pay

## 2019-01-13 ENCOUNTER — Emergency Department (HOSPITAL_BASED_OUTPATIENT_CLINIC_OR_DEPARTMENT_OTHER)
Admission: EM | Admit: 2019-01-13 | Discharge: 2019-01-13 | Disposition: A | Discharge status: Home / Self Care | Payer: Self-pay | Attending: Emergency Medicine | Admitting: Emergency Medicine

## 2019-01-13 DIAGNOSIS — F17200 Nicotine dependence, unspecified, uncomplicated: Secondary | ICD-10-CM | POA: Insufficient documentation

## 2019-01-13 DIAGNOSIS — N492 Inflammatory disorders of scrotum: Secondary | ICD-10-CM | POA: Insufficient documentation

## 2019-01-13 LAB — URINALYSIS, ROUTINE W REFLEX MICROSCOPIC
Bilirubin Urine: NEGATIVE
Glucose, UA: NEGATIVE mg/dL
Hgb urine dipstick: NEGATIVE
Ketones, ur: NEGATIVE mg/dL
Leukocytes,Ua: NEGATIVE
Nitrite: NEGATIVE
Protein, ur: NEGATIVE mg/dL
Specific Gravity, Urine: 1.02 (ref 1.005–1.030)
pH: 7 (ref 5.0–8.0)

## 2019-01-13 LAB — CBG MONITORING, ED: Glucose-Capillary: 95 mg/dL (ref 70–99)

## 2019-01-13 MED ORDER — SULFAMETHOXAZOLE-TRIMETHOPRIM 800-160 MG PO TABS
1.0000 | ORAL_TABLET | Freq: Once | ORAL | Status: AC
  Administered 2019-01-13: 1 via ORAL
  Filled 2019-01-13: qty 1

## 2019-01-13 MED ORDER — SULFAMETHOXAZOLE-TRIMETHOPRIM 800-160 MG PO TABS: 1.0000 | ORAL_TABLET | Freq: Two times a day (BID) | ORAL | 0 refills | Status: AC

## 2019-01-13 NOTE — Discharge Instructions (Signed)
Take bactrim twice daily for a week.   Avoid shaving for a week. The wound will likely continues to drain for several days   Wear loose clothing   See your doctor  Return to ER if you have fever, vomiting, worse swelling and pain
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
## 2019-01-13 NOTE — ED Triage Notes (Signed)
Pt c/o abscess to testicle 2 days

## 2019-10-03 IMAGING — US ULTRASOUND OF SCROTUM
1 series · 13 of 25 positions shown · non-contrast
Comparison: None.

CLINICAL DATA: Right scrotal abscess. Draining abscess right
superior scrotum.

EXAM:
ULTRASOUND OF SCROTUM
TECHNIQUE: Complete ultrasound examination of the testicles, epididymis, and
other scrotal structures was performed.

[Series 1: ultrasound of scrotum · 13 of 49 slices shown]
[im 1/49]
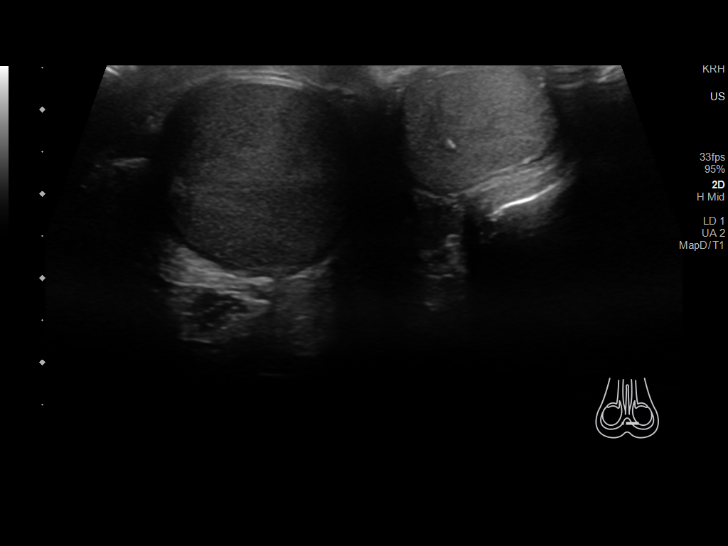
[im 5/49]
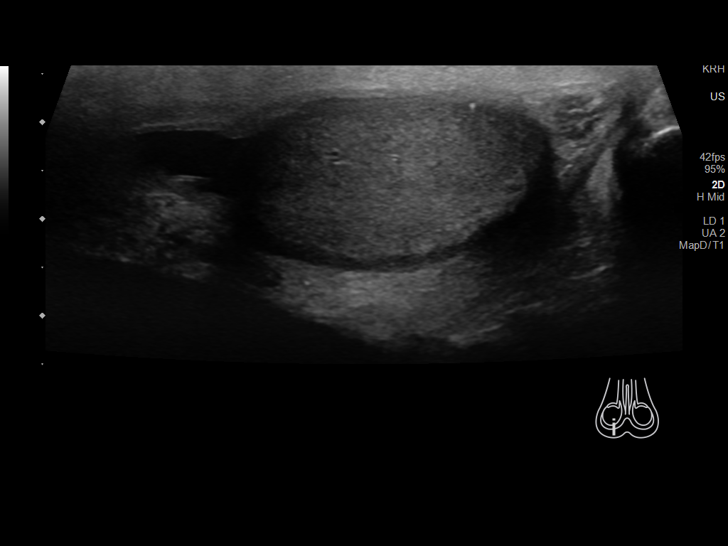
[im 9/49]
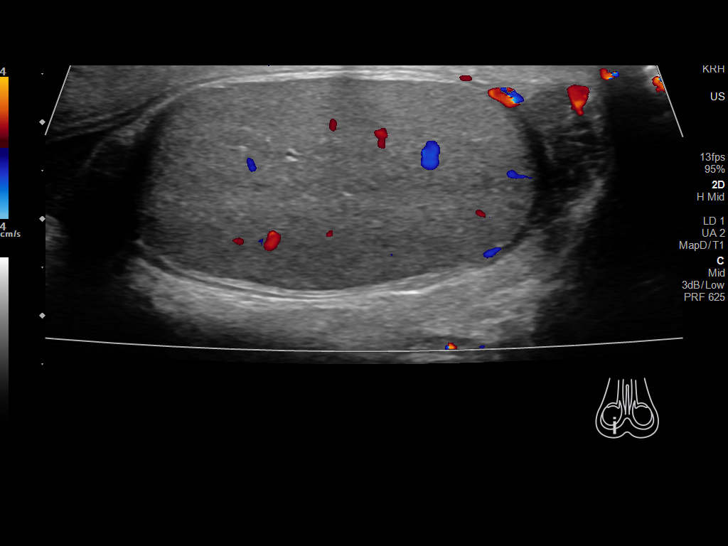
[im 13/49]
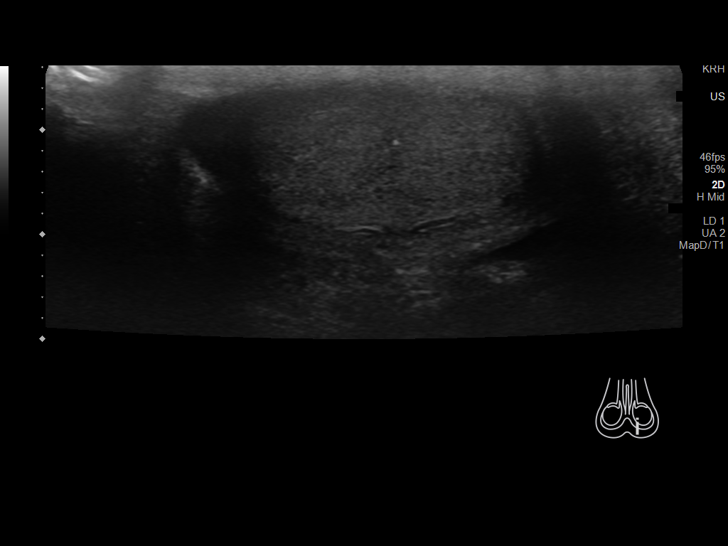
[im 17/49]
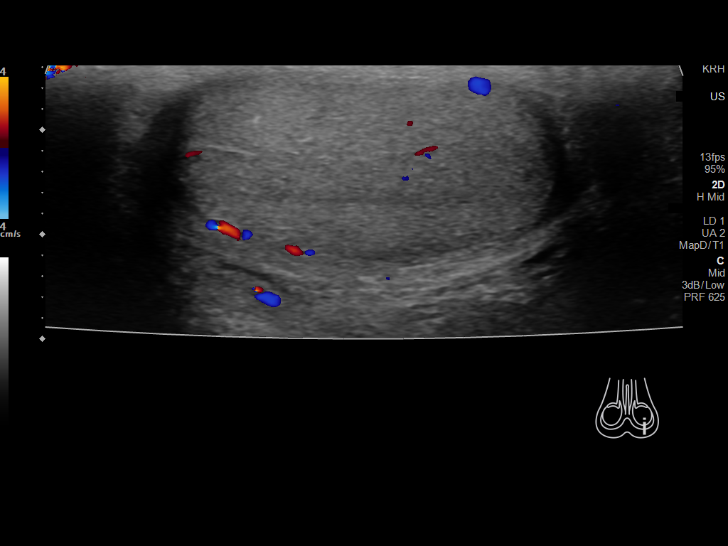
[im 21/49]
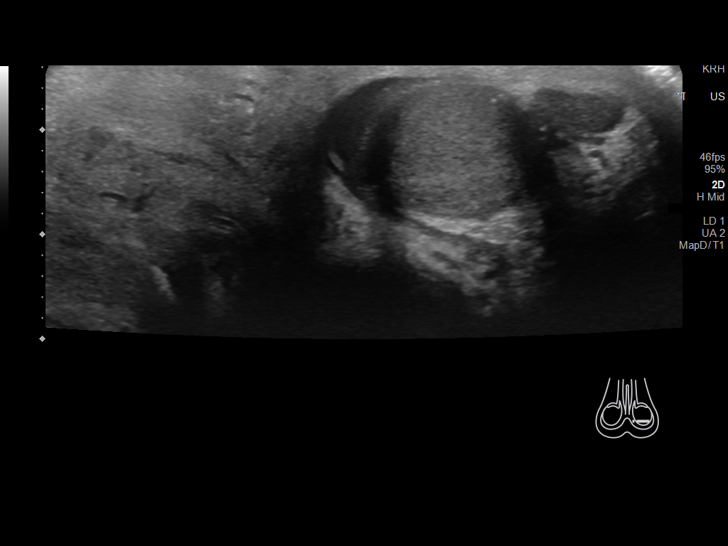
[im 25/49]
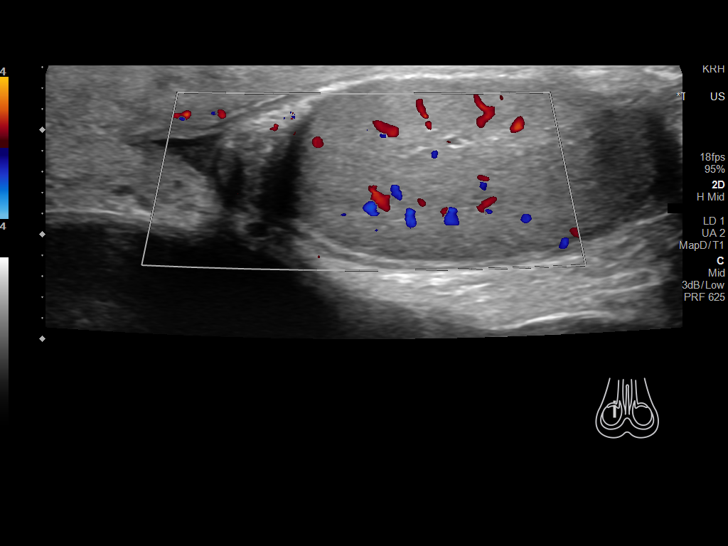
[im 29/49]
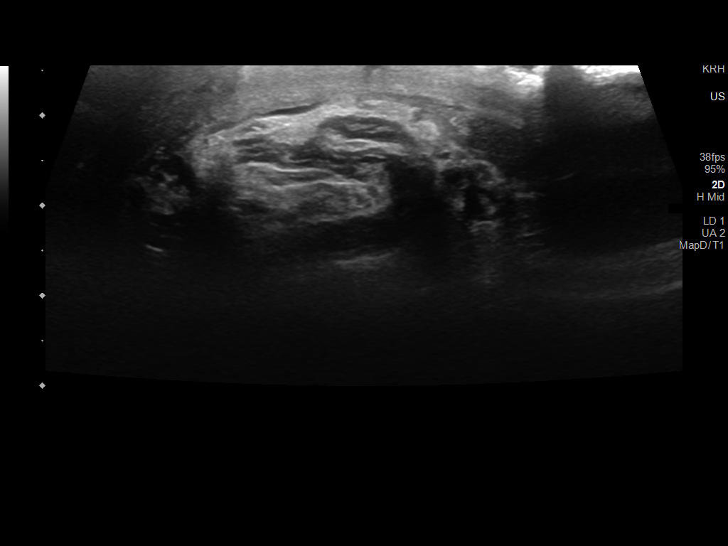
[im 33/49]
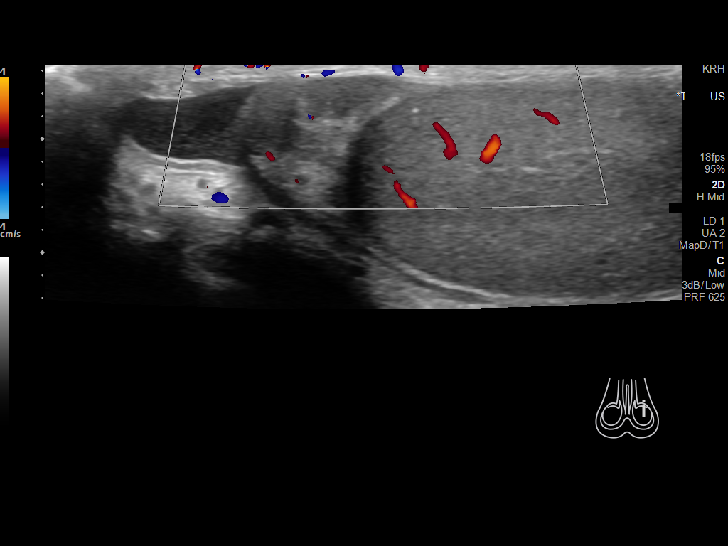
[im 37/49]
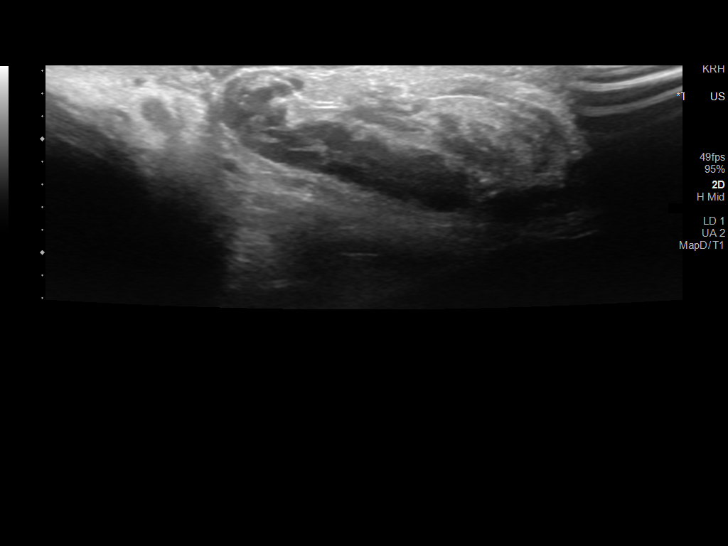
[im 41/49]
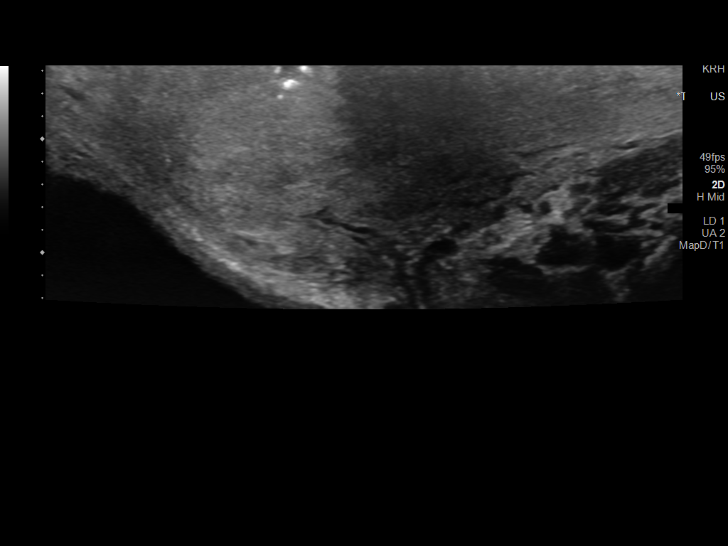
[im 45/49]
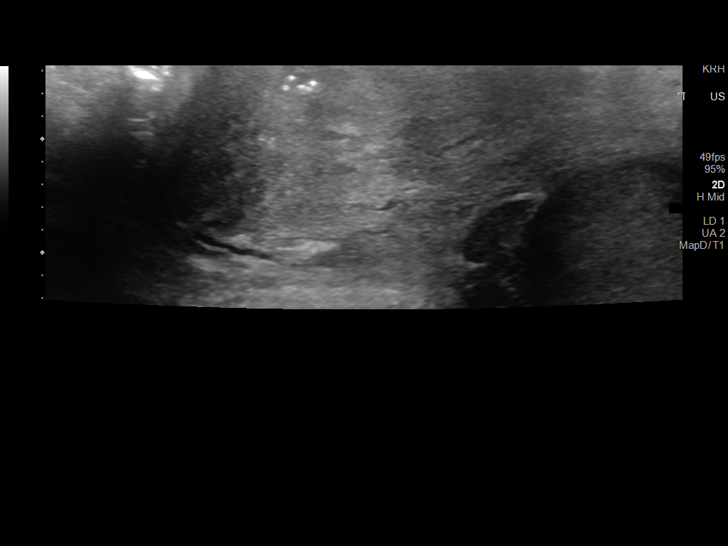
[im 49/49]
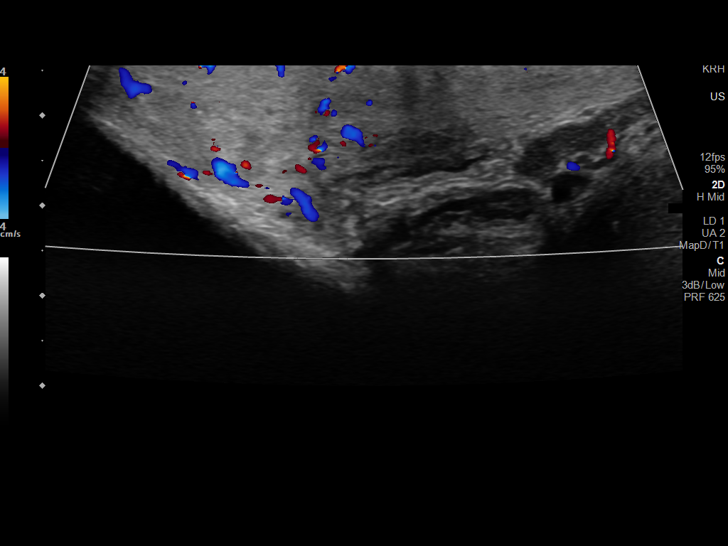

[13 of 25 positions shown; findings below may reference images not displayed]

FINDINGS: Right testicle

Measurements: 4.1 x 2.2 x 2.7 cm. Normal blood flow without
hyperemia. No intra testicular mass or fluid collection. There are
scattered microlithiasis.

Left testicle

Measurements: 4.3 x 1.8 x 2.2 cm. Homogeneous echotexture with
normal blood flow. No intra testicular mass. Scattered
microlithiasis.

Right epididymis:  Normal in size and appearance.  No hyperemia.

Left epididymis:  Normal in size and appearance.

Hydrocele:  None visualized.

Varicocele:  None visualized.

Other: In the area of clinical concern in the right superior scrotum
demonstrates skin thickening with hyperemia and tiny echogenic foci,
likely site of drainage. No focal fluid collection.
IMPRESSION: 1. Scrotal skin thickening and hyperemia in the right superior
scrotum in the area of clinical concern consistent with cellulitis.
No drainable abscess.
2. No evidence of deep scrotal or testicular extension. No
testicular hyperemia or focal fluid collection.
3. Bilateral testicular microlithiasis. Current literature suggests
that testicular microlithiasis is not a significant independent risk
factor for development of testicular carcinoma, and that follow up
imaging is not warranted in the absence of other risk factors.
Monthly testicular self-examination and annual physical exams are
considered appropriate surveillance. If patient has other risk
factors for testicular carcinoma, then referral to Urology should be
considered. (Reference: Ceejay, et al.: A 5-Year Follow up Study
of Asymptomatic Men with Testicular Microlithiasis. J Urol 8776;
